# Patient Record
Sex: Female | Born: 1975 | Race: White | Hispanic: No | Smoking: Current every day smoker
Health system: Southern US, Community
[De-identification: ages and names within clinical notes are randomized; demographics above are authoritative.]

## PROBLEM LIST (undated history)

## (undated) DIAGNOSIS — N289 Disorder of kidney and ureter, unspecified: Secondary | ICD-10-CM

## (undated) HISTORY — PX: TUBAL LIGATION: SHX77

---

## 2010-12-03 ENCOUNTER — Ambulatory Visit: Payer: Self-pay

## 2011-02-04 ENCOUNTER — Emergency Department: Payer: Self-pay | Admitting: Emergency Medicine

## 2011-02-28 ENCOUNTER — Emergency Department: Payer: Self-pay | Admitting: Emergency Medicine

## 2011-03-14 ENCOUNTER — Emergency Department: Payer: Self-pay | Admitting: Emergency Medicine

## 2011-04-02 ENCOUNTER — Emergency Department: Payer: Self-pay | Admitting: Internal Medicine

## 2011-04-02 LAB — COMPREHENSIVE METABOLIC PANEL
Albumin: 4.1 g/dL (ref 3.4–5.0)
Anion Gap: 11 (ref 7–16)
Calcium, Total: 8.9 mg/dL (ref 8.5–10.1)
EGFR (African American): >60
EGFR (Non-African Amer.): >60
Glucose: 102 mg/dL — ABNORMAL HIGH (ref 65–99)
Potassium: 4.3 mmol/L (ref 3.5–5.1)
SGOT(AST): 17 U/L (ref 15–37)

## 2011-04-02 LAB — URINALYSIS, COMPLETE
Bilirubin,UR: NEGATIVE
Ketone: NEGATIVE
Ph: 6 (ref 4.5–8.0)
Protein: NEGATIVE
Specific Gravity: 1.005 (ref 1.003–1.030)
Squamous Epithelial: <1
WBC UR: <1 /HPF (ref 0–5)

## 2011-04-02 LAB — CBC
HCT: 42.4 % (ref 35.0–47.0)
MCHC: 34.7 g/dL (ref 32.0–36.0)
MCV: 91 fL (ref 80–100)
RDW: 12.2 % (ref 11.5–14.5)

## 2011-04-02 LAB — LIPASE, BLOOD: Lipase: 177 U/L (ref 73–393)

## 2011-04-02 LAB — PREGNANCY, URINE: Pregnancy Test, Urine: NEGATIVE m[IU]/mL

## 2011-05-11 ENCOUNTER — Emergency Department: Payer: Self-pay | Admitting: Emergency Medicine

## 2011-06-25 ENCOUNTER — Emergency Department: Payer: Self-pay | Admitting: Emergency Medicine

## 2011-06-25 LAB — BASIC METABOLIC PANEL
BUN: 16 mg/dL (ref 7–18)
Calcium, Total: 9.3 mg/dL (ref 8.5–10.1)
Creatinine: 0.91 mg/dL (ref 0.60–1.30)
Glucose: 121 mg/dL — ABNORMAL HIGH (ref 65–99)
Potassium: 4.1 mmol/L (ref 3.5–5.1)
Sodium: 139 mmol/L (ref 136–145)

## 2011-06-25 LAB — CBC
HGB: 15.1 g/dL (ref 12.0–16.0)
MCV: 92 fL (ref 80–100)
Platelet: 293 10*3/uL (ref 150–440)
RBC: 4.85 10*6/uL (ref 3.80–5.20)

## 2011-06-25 LAB — URINALYSIS, COMPLETE
Bacteria: NONE SEEN
Ketone: NEGATIVE
Nitrite: NEGATIVE
Protein: NEGATIVE
RBC,UR: 1 /HPF (ref 0–5)
Specific Gravity: 1.021 (ref 1.003–1.030)
WBC UR: 1 /HPF (ref 0–5)

## 2011-06-25 LAB — WET PREP, GENITAL

## 2011-07-11 ENCOUNTER — Emergency Department (HOSPITAL_COMMUNITY)
Admission: EM | Admit: 2011-07-11 | Discharge: 2011-07-12 | Disposition: A | Discharge status: Home / Self Care | Payer: Self-pay | Attending: Emergency Medicine | Admitting: Emergency Medicine

## 2011-07-11 ENCOUNTER — Encounter (HOSPITAL_COMMUNITY): Payer: Self-pay | Admitting: *Deleted

## 2011-07-11 DIAGNOSIS — N83209 Unspecified ovarian cyst, unspecified side: Secondary | ICD-10-CM | POA: Insufficient documentation

## 2011-07-11 DIAGNOSIS — R110 Nausea: Secondary | ICD-10-CM | POA: Insufficient documentation

## 2011-07-11 DIAGNOSIS — R109 Unspecified abdominal pain: Secondary | ICD-10-CM | POA: Insufficient documentation

## 2011-07-11 LAB — BASIC METABOLIC PANEL
CO2: 25 mEq/L (ref 19–32)
Calcium: 8.9 mg/dL (ref 8.4–10.5)
Chloride: 104 mEq/L (ref 96–112)
GFR calc Af Amer: >90 mL/min (ref >90)
Sodium: 137 mEq/L (ref 135–145)

## 2011-07-11 LAB — DIFFERENTIAL
Basophils Absolute: 0 10*3/uL (ref 0.0–0.1)
Eosinophils Relative: 0 % (ref 0–5)
Lymphocytes Relative: 27 % (ref 12–46)
Lymphs Abs: 2.2 10*3/uL (ref 0.7–4.0)
Neutro Abs: 5.4 10*3/uL (ref 1.7–7.7)

## 2011-07-11 LAB — URINALYSIS, ROUTINE W REFLEX MICROSCOPIC
Glucose, UA: NEGATIVE mg/dL
Ketones, ur: NEGATIVE mg/dL
Leukocytes, UA: NEGATIVE
Nitrite: NEGATIVE
Specific Gravity, Urine: 1.034 — ABNORMAL HIGH (ref 1.005–1.030)
pH: 7.5 (ref 5.0–8.0)

## 2011-07-11 LAB — CBC
MCV: 89.9 fL (ref 78.0–100.0)
Platelets: 202 10*3/uL (ref 150–400)
RBC: 4.14 MIL/uL (ref 3.87–5.11)
RDW: 13 % (ref 11.5–15.5)
WBC: 8.2 10*3/uL (ref 4.0–10.5)

## 2011-07-11 LAB — POCT PREGNANCY, URINE: Preg Test, Ur: NEGATIVE

## 2011-07-11 MED ORDER — IOHEXOL 300 MG/ML  SOLN
20.0000 mL | INTRAMUSCULAR | Status: AC
  Administered 2011-07-11: 20 mL via ORAL

## 2011-07-11 MED ORDER — HYDROMORPHONE HCL PF 1 MG/ML IJ SOLN
1.0000 mg | Freq: Once | INTRAMUSCULAR | Status: AC
  Administered 2011-07-11: 1 mg via INTRAVENOUS
  Filled 2011-07-11: qty 1

## 2011-07-11 MED ORDER — ONDANSETRON HCL 4 MG/2ML IJ SOLN
4.0000 mg | Freq: Once | INTRAMUSCULAR | Status: AC
  Administered 2011-07-11: 4 mg via INTRAVENOUS
  Filled 2011-07-11: qty 2

## 2011-07-11 MED ORDER — SODIUM CHLORIDE 0.9 % IV BOLUS (SEPSIS)
1000.0000 mL | Freq: Once | INTRAVENOUS | Status: AC
  Administered 2011-07-11: 1000 mL via INTRAVENOUS

## 2011-07-11 NOTE — ED Notes (Addendum)
Patient complaining of right upper and lower quadrant abdominal pain that radiates around her right side and to her right flank area.  Patient states that the pain started around 1200 this afternoon (before patient ate lunch).  Patient describes pain as "intermittent" and "sharp"; rates pain 8/10 on the numerical pain scale.  Reports nausea; denies vomiting and diarrhea.  Denies any urinary changes and abnormal vaginal discharge.  Patient alert and oriented x4; PERRL present.  Upon arrival to room, patient changed into gown.  Will continue to monitor.

## 2011-07-11 NOTE — ED Provider Notes (Signed)
History     CSN: [id number]  Arrival date & time 07/11/11  2040   First MD Initiated Contact with Patient 07/11/11 2159      Chief Complaint  Patient presents with  . Abdominal Pain    (Consider location/radiation/quality/duration/timing/severity/associated sxs/prior treatment) HPI History from patient. 36 year old female who presents with right-sided abdominal pain. This began around 1 PM today. It is constant in nature with slight waxes and wanes in intensity. Pain is described as sharp. She has tried Tylenol, ibuprofen, and Pepto-Bismol without any relief. Pain is improved slightly with applying pressure to the area, and worsens when she lets go. States that the pain is worsened with sudden movements; she did notice that it worsened during the car ride to the emergency department. She has had some nausea without any vomiting or diarrhea. Denies any vaginal bleeding, discharge, or urinary symptoms. Denies fever or chills. She's never had anything like this in the past. Surgical history includes tubal ligation.  History reviewed. No pertinent past medical history.  Past Surgical History  Procedure Date  . Tubal ligation     Family History  Problem Relation Age of Onset  . Heart failure Father   . Hypertension Father   . Hyperlipidemia Father     History  Substance Use Topics  . Smoking status: Current Everyday Smoker -- 0.5 packs/day  . Smokeless tobacco: Not on file  . Alcohol Use: Yes    OB History    Grav Para Term Preterm Abortions TAB SAB Ect Mult Living                  Review of Systems  Constitutional: Negative for fever, chills, activity change and appetite change.  Respiratory: Negative for cough and shortness of breath.   Cardiovascular: Negative for chest pain and palpitations.  Gastrointestinal: Positive for nausea and abdominal pain. Negative for vomiting, diarrhea and constipation.  Genitourinary: Negative for dysuria, flank pain, vaginal bleeding,  vaginal discharge and vaginal pain.  Musculoskeletal: Negative for myalgias.  Skin: Negative for color change and rash.  Neurological: Negative for dizziness and weakness.    Allergies  Penicillins  Home Medications   Current Outpatient Rx  Name Route Sig Dispense Refill  . ACETAMINOPHEN 500 MG PO TABS Oral Take 2,000 mg by mouth 2 (two) times daily as needed. For pain    . BISMUTH SUBSALICYLATE 262 MG/15ML PO SUSP Oral Take 15 mLs by mouth once. For nausea    . IBUPROFEN 200 MG PO TABS Oral Take 800 mg by mouth once. For pain      BP 140/76  Pulse 71  Temp(Src) 98.3 F (36.8 C) (Oral)  Resp 16  SpO2 98%  LMP 05/07/2011  Physical Exam  Nursing note and vitals reviewed. Constitutional: She is oriented to person, place, and time. She appears well-developed and well-nourished. No distress.       Uncomfortable appearing  HENT:  Head: Normocephalic and atraumatic.  Eyes: EOM are normal.  Neck: Normal range of motion.  Cardiovascular: Normal rate, regular rhythm and normal heart sounds.   Pulmonary/Chest: Effort normal and breath sounds normal. She exhibits no tenderness.  Abdominal: Soft. Bowel sounds are normal. There is tenderness in the right lower quadrant. There is rebound. There is no guarding and no CVA tenderness.    Musculoskeletal: Normal range of motion.  Neurological: She is alert and oriented to person, place, and time.  Skin: Skin is warm and dry. She is not diaphoretic.  Psychiatric: She has a  normal mood and affect.    ED Course  Procedures (including critical care time)  Labs Reviewed  BASIC METABOLIC PANEL - Abnormal; Notable for the following:    Glucose, Bld 105 (*)    All other components within normal limits  URINALYSIS, ROUTINE W REFLEX MICROSCOPIC - Abnormal; Notable for the following:    Specific Gravity, Urine 1.034 (*)    All other components within normal limits  CBC  DIFFERENTIAL  POCT PREGNANCY, URINE   Ct Abdomen Pelvis W  Contrast  07/12/2011  *RADIOLOGY REPORT*  Clinical Data: Right lower quadrant abdominal pain  CT ABDOMEN AND PELVIS WITH CONTRAST  Technique:  Multidetector CT imaging of the abdomen and pelvis was performed following the standard protocol during bolus administration of intravenous contrast.  Contrast: OMNIPAQUE IOHEXOL 300 MG/ML  SOLN  Comparison: None.  Findings: Limited images through the lung bases demonstrate no significant appreciable abnormality. The heart size is within normal limits. No pleural or pericardial effusion.  Unremarkable liver, biliary system, spleen, pancreas, adrenal glands, kidneys.  No hydronephrosis or hydroureter.  No bowel obstruction.  No CT evidence for colitis.  Appendix within normal limits.  No free intraperitoneal air.  Thin-walled bladder.  Corpus luteal cyst noted on the right.  There is a small amount of free fluid within the pelvis. Adnexal clip noted on the right.  No acute osseous abnormality.  IMPRESSION: Normal appendix.  Corpus luteal cyst noted on the right and a small amount of free fluid is present within the pelvis.  This is likely physiologic. If there remains clinical concern for ovarian pathology, consider ultrasound.  Original Report Authenticated By: Waneta Martins, M.D.     No diagnosis found.    MDM  10:30 PM Patient assessed. Tender to palpation with rebound in the right lower quadrant. Suspicious for appendicitis. We'll plan to obtain CT of abdomen and pelvis. Labs which were ordered in triage generally unremarkable. Dilaudid ordered for pain mgmt.  2:32 AM Pt's CT without evidence for acute appendicitis. She does have some free fluid in the pelvis and a corpus luteal cyst. Still having pain at this point despite 2mg  total of Dilaudid; states the meds do help briefly but pain comes back. Abd remains soft, tender to palp in RLQ. Will plan to proceed with US pelvis to r/o ovarian pathology. Pt care signed out to Dr. Nicanor Alcon. She will  follow results and dispo as appropriate.      Grant Fontana, [place] 07/12/11 [phone]

## 2011-07-11 NOTE — ED Notes (Signed)
Patient currently sitting up in bed; no respiratory or acute distress noted.  Patient currently rating pain 7/10; patient medicated.  Patient has no other questions or concerns at this time; patient currently finished one contrast/water cup.  CT at bedside to check up on bedside.  Will continue to monitor.

## 2011-07-11 NOTE — ED Notes (Addendum)
C/o mid R abd pain, onset after lunch around 1300, fluctuates, comes back worse every time, some nausea & radiation around to R flank, but primarily mid R abd, (denies: groin pain, vd, fever, bleeding, urinary or vaginal sx). Took tylenol and ibuprofen all day today and pepto bismol w/o relief. "worse lying flat, applied pressure helps, worse when she lets go". Mild moaning/grunting at this time, tachypneic, applying pressure to R abd. Alert, interactive. Last ate 1730.

## 2011-07-11 NOTE — ED Notes (Signed)
Patient currently resting quietly in bed; no respiratory or acute distress noted.  Patient updated on plan of care; informed patient that she is going to be taken for a CT scan.  Patient has no other questions or concerns at this time; will continue to monitor.

## 2011-07-12 ENCOUNTER — Emergency Department (HOSPITAL_COMMUNITY): Payer: Self-pay

## 2011-07-12 MED ORDER — HYDROMORPHONE HCL PF 1 MG/ML IJ SOLN
1.0000 mg | Freq: Once | INTRAMUSCULAR | Status: AC
  Administered 2011-07-12: 1 mg via INTRAVENOUS
  Filled 2011-07-12: qty 1

## 2011-07-12 MED ORDER — IOHEXOL 300 MG/ML  SOLN
100.0000 mL | Freq: Once | INTRAMUSCULAR | Status: AC | PRN
  Administered 2011-07-12: 100 mL via INTRAVENOUS

## 2011-07-12 MED ORDER — TRAMADOL HCL 50 MG PO TABS: 50.0000 mg | ORAL_TABLET | Freq: Four times a day (QID) | ORAL | Status: AC | PRN

## 2011-07-12 MED ORDER — OXYCODONE-ACETAMINOPHEN 5-325 MG PO TABS
1.0000 | ORAL_TABLET | Freq: Once | ORAL | Status: AC
  Administered 2011-07-12: 1 via ORAL
  Filled 2011-07-12: qty 1

## 2011-07-12 NOTE — ED Notes (Signed)
Patient ambulated to restroom with no difficulty; currently sitting up in bed; no respiratory or acute distress noted.  Patient states that she is done drinking CT contrast/water cups.  CT called and notified.  Patient has no other questions or concerns at this time; will continue to monitor.

## 2011-07-12 NOTE — ED Notes (Signed)
PA Catherine at bedside.

## 2011-07-12 NOTE — ED Notes (Signed)
Patient currently resting quietly in bed; no respiratory or acute distress noted.  Updated patient on plan of care; informed patient that CT results are back and that we are currently waiting on EDP to come and talk about results.  Patient has no other questions or concerns; will continue to monitor.

## 2011-07-12 NOTE — ED Notes (Signed)
Patient back from CT; currently sitting up in bed; no respiratory or acute distress noted.  Patient updated on plan of care; informed patient that we are currently waiting on CT results to come back.  Patient has no other questions or concerns; will continue to monitor.

## 2011-07-12 NOTE — Discharge Instructions (Signed)
Ovarian Cyst The ovaries are small organs that are on each side of the uterus. The ovaries are the organs that produce the female hormones, estrogen and progesterone. An ovarian cyst is a sac filled with fluid that can vary in its size. It is normal for a small cyst to form in women who are in the childbearing age and who have menstrual periods. This type of cyst is called a follicle cyst that becomes an ovulation cyst (corpus luteum cyst) after it produces the women's egg. It later goes away on its own if the woman does not become pregnant. There are other kinds of ovarian cysts that may cause problems and may need to be treated. The most serious problem is a cyst with cancer. It should be noted that menopausal women who have an ovarian cyst are at a higher risk of it being a cancer cyst. They should be evaluated very quickly, thoroughly and followed closely. This is especially true in menopausal women because of the high rate of ovarian cancer in women in menopause. CAUSES AND TYPES OF OVARIAN CYSTS:  FUNCTIONAL CYST: The follicle/corpus luteum cyst is a functional cyst that occurs every month during ovulation with the menstrual cycle. They go away with the next menstrual cycle if the woman does not get pregnant. Usually, there are no symptoms with a functional cyst.   ENDOMETRIOMA CYST: This cyst develops from the lining of the uterus tissue. This cyst gets in or on the ovary. It grows every month from the bleeding during the menstrual period. It is also called a "chocolate cyst" because it becomes filled with blood that turns brown. This cyst can cause pain in the lower abdomen during intercourse and with your menstrual period.   CYSTADENOMA CYST: This cyst develops from the cells on the outside of the ovary. They usually are not cancerous. They can get very big and cause lower abdomen pain and pain with intercourse. This type of cyst can twist on itself, cut off its blood supply and cause severe pain.  It also can easily rupture and cause a lot of pain.   DERMOID CYST: This type of cyst is sometimes found in both ovaries. They are found to have different kinds of body tissue in the cyst. The tissue includes skin, teeth, hair, and/or cartilage. They usually do not have symptoms unless they get very big. Dermoid cysts are rarely cancerous.   POLYCYSTIC OVARY: This is a rare condition with hormone problems that produces many small cysts on both ovaries. The cysts are follicle-like cysts that never produce an egg and become a corpus luteum. It can cause an increase in body weight, infertility, acne, increase in body and facial hair and lack of menstrual periods or rare menstrual periods. Many women with this problem develop type 2 diabetes. The exact cause of this problem is unknown. A polycystic ovary is rarely cancerous.   THECA LUTEIN CYST: Occurs when too much hormone (human chorionic gonadotropin) is produced and over-stimulates the ovaries to produce an egg. They are frequently seen when doctors stimulate the ovaries for invitro-fertilization (test tube babies).   LUTEOMA CYST: This cyst is seen during pregnancy. Rarely it can cause an obstruction to the birth canal during labor and delivery. They usually go away after delivery.  SYMPTOMS   Pelvic pain or pressure.   Pain during sexual intercourse.   Increasing girth (swelling) of the abdomen.   Abnormal menstrual periods.   Increasing pain with menstrual periods.   You stop having   menstrual periods and you are not pregnant.  DIAGNOSIS  The diagnosis can be made during:  Routine or annual pelvic examination (common).   Ultrasound.   X-ray of the pelvis.   CT Scan.   MRI.   Blood tests.  TREATMENT   Treatment may only be to follow the cyst monthly for 2 to 3 months with your caregiver. Many go away on their own, especially functional cysts.   May be aspirated (drained) with a long needle with ultrasound, or by laparoscopy  (inserting a tube into the pelvis through a small incision).   The whole cyst can be removed by laparoscopy.   Sometimes the cyst may need to be removed through an incision in the lower abdomen.   Hormone treatment is sometimes used to help dissolve certain cysts.   Birth control pills are sometimes used to help dissolve certain cysts.  HOME CARE INSTRUCTIONS  Follow your caregiver's advice regarding:  Medicine.   Follow up visits to evaluate and treat the cyst.   You may need to come back or make an appointment with another caregiver, to find the exact cause of your cyst, if your caregiver is not a gynecologist.   Get your yearly and recommended pelvic examinations and Pap tests.   Let your caregiver know if you have had an ovarian cyst in the past.  SEEK MEDICAL CARE IF:   Your periods are late, irregular, they stop, or are painful.   Your stomach (abdomen) or pelvic pain does not go away.   Your stomach becomes larger or swollen.   You have pressure on your bladder or trouble emptying your bladder completely.   You have painful sexual intercourse.   You have feelings of fullness, pressure, or discomfort in your stomach.   You lose weight for no apparent reason.   You feel generally ill.   You become constipated.   You lose your appetite.   You develop acne.   You have an increase in body and facial hair.   You are gaining weight, without changing your exercise and eating habits.   You think you are pregnant.  SEEK IMMEDIATE MEDICAL CARE IF:   You have increasing abdominal pain.   You feel sick to your stomach (nausea) and/or vomit.   You develop a fever that comes on suddenly.   You develop abdominal pain during a bowel movement.   Your menstrual periods become heavier than usual.  Document Released: 03/03/2005 Document Revised: 02/20/2011 Document Reviewed: 01/04/2009 [employer] Patient Information 2012 [employer]. 

## 2011-07-12 NOTE — ED Notes (Signed)
Pt transported to [place] with chaperone.

## 2011-07-12 NOTE — ED Notes (Signed)
Pt tranferred from POD A7 with c/o 8/10 RLQ abd pain and nausea.  Abd soft, tender to touch on RLQ.  Active bowel sounds.  LBM 07/11/11  LMP 05/07/11.  Awaiting [place].  Husband at bedside.

## 2011-07-14 NOTE — ED Provider Notes (Signed)
Medical screening examination/treatment/procedure(s) were performed by non-physician practitioner and as supervising physician I was immediately available for consultation/collaboration.  Cheri Guppy, MD 07/14/11 1556

## 2011-07-19 NOTE — ED Notes (Signed)
Attempted to contact patient x two today regarding CT findings. Message left to call flow manager's number#.

## 2011-07-20 NOTE — ED Notes (Signed)
Left voicemail for patient to call flow manager's office number

## 2011-07-30 ENCOUNTER — Emergency Department: Payer: Self-pay

## 2011-07-30 LAB — URINALYSIS, COMPLETE
Bacteria: NONE SEEN
Ketone: NEGATIVE
Leukocyte Esterase: NEGATIVE
Ph: 8 (ref 4.5–8.0)
Squamous Epithelial: 2

## 2011-07-30 LAB — COMPREHENSIVE METABOLIC PANEL
Albumin: 3.6 g/dL (ref 3.4–5.0)
Alkaline Phosphatase: 50 U/L (ref 50–136)
Anion Gap: 5 — ABNORMAL LOW (ref 7–16)
BUN: 9 mg/dL (ref 7–18)
Bilirubin,Total: 0.2 mg/dL (ref 0.2–1.0)
Calcium, Total: 8.8 mg/dL (ref 8.5–10.1)
Co2: 27 mmol/L (ref 21–32)
Creatinine: 0.61 mg/dL (ref 0.60–1.30)
EGFR (African American): >60
Osmolality: 276 (ref 275–301)
Potassium: 4 mmol/L (ref 3.5–5.1)
Sodium: 139 mmol/L (ref 136–145)

## 2011-07-30 LAB — CBC
HCT: 38.7 % (ref 35.0–47.0)
MCHC: 34.6 g/dL (ref 32.0–36.0)
MCV: 93 fL (ref 80–100)
RBC: 4.17 10*6/uL (ref 3.80–5.20)
WBC: 5.2 10*3/uL (ref 3.6–11.0)

## 2011-07-30 LAB — DRUG SCREEN, URINE
MDMA (Ecstasy)Ur Screen: NEGATIVE (ref ?–500)
Opiate, Ur Screen: NEGATIVE (ref ?–300)

## 2011-07-31 NOTE — ED Notes (Signed)
Patient called flow manager's office after receiving letter to call. Information given regarding CT/US findings and instructed patient to follow-up with GYN as suggested by radiologist. Patient verbalized understanding and stated she would seek follow-up with own GYN as soon as possible.

## 2011-08-02 ENCOUNTER — Emergency Department: Payer: Self-pay | Admitting: Emergency Medicine

## 2011-08-02 LAB — ETHANOL: Ethanol: <3 mg/dL

## 2011-08-02 LAB — CBC
HCT: 40.3 % (ref 35.0–47.0)
MCV: 92 fL (ref 80–100)
Platelet: 256 10*3/uL (ref 150–440)
RBC: 4.37 10*6/uL (ref 3.80–5.20)
RDW: 13.6 % (ref 11.5–14.5)
WBC: 7.3 10*3/uL (ref 3.6–11.0)

## 2011-08-02 LAB — COMPREHENSIVE METABOLIC PANEL
BUN: 8 mg/dL (ref 7–18)
Co2: 26 mmol/L (ref 21–32)
Creatinine: 0.73 mg/dL (ref 0.60–1.30)
Potassium: 3.4 mmol/L — ABNORMAL LOW (ref 3.5–5.1)

## 2011-08-02 LAB — TSH: Thyroid Stimulating Horm: 1.82 u[IU]/mL

## 2011-08-02 LAB — SALICYLATE LEVEL: Salicylates, Serum: 2.9 mg/dL — ABNORMAL HIGH

## 2011-08-02 LAB — ACETAMINOPHEN LEVEL: Acetaminophen: <2 ug/mL

## 2011-08-03 LAB — DRUG SCREEN, URINE
Cannabinoid 50 Ng, Ur ~~LOC~~: NEGATIVE (ref ?–50)
Methadone, Ur Screen: NEGATIVE (ref ?–300)
Opiate, Ur Screen: NEGATIVE (ref ?–300)
Tricyclic, Ur Screen: NEGATIVE (ref ?–1000)

## 2011-08-04 ENCOUNTER — Emergency Department: Payer: Self-pay | Admitting: Emergency Medicine

## 2011-09-03 ENCOUNTER — Emergency Department: Payer: Self-pay | Admitting: *Deleted

## 2011-09-06 ENCOUNTER — Emergency Department: Payer: Self-pay | Admitting: Emergency Medicine

## 2011-09-06 LAB — URINALYSIS, COMPLETE
Glucose,UR: NEGATIVE mg/dL (ref 0–75)
Ketone: NEGATIVE
Leukocyte Esterase: NEGATIVE
Nitrite: NEGATIVE
Ph: 7 (ref 4.5–8.0)
RBC,UR: <1 /HPF (ref 0–5)

## 2011-09-06 LAB — COMPREHENSIVE METABOLIC PANEL
Anion Gap: 11 (ref 7–16)
BUN: 8 mg/dL (ref 7–18)
EGFR (African American): >60
EGFR (Non-African Amer.): >60
Total Protein: 8.1 g/dL (ref 6.4–8.2)

## 2011-09-06 LAB — CBC
HCT: 40.8 % (ref 35.0–47.0)
HGB: 13.8 g/dL (ref 12.0–16.0)
MCHC: 33.8 g/dL (ref 32.0–36.0)
MCV: 91 fL (ref 80–100)
Platelet: 245 10*3/uL (ref 150–440)
RBC: 4.46 10*6/uL (ref 3.80–5.20)
RDW: 13.2 % (ref 11.5–14.5)

## 2011-09-06 LAB — PREGNANCY, URINE: Pregnancy Test, Urine: NEGATIVE m[IU]/mL

## 2012-01-03 ENCOUNTER — Emergency Department (HOSPITAL_COMMUNITY)
Admission: EM | Admit: 2012-01-03 | Discharge: 2012-01-04 | Disposition: A | Discharge status: Home / Self Care | Payer: Self-pay | Attending: Emergency Medicine | Admitting: Emergency Medicine

## 2012-01-03 ENCOUNTER — Encounter (HOSPITAL_COMMUNITY): Payer: Self-pay

## 2012-01-03 ENCOUNTER — Emergency Department (HOSPITAL_COMMUNITY): Payer: Self-pay

## 2012-01-03 DIAGNOSIS — R109 Unspecified abdominal pain: Secondary | ICD-10-CM | POA: Insufficient documentation

## 2012-01-03 DIAGNOSIS — R300 Dysuria: Secondary | ICD-10-CM | POA: Insufficient documentation

## 2012-01-03 HISTORY — DX: Disorder of kidney and ureter, unspecified: N28.9

## 2012-01-03 LAB — CBC WITH DIFFERENTIAL/PLATELET
Basophils Absolute: 0 10*3/uL (ref 0.0–0.1)
Eosinophils Absolute: 0.1 10*3/uL (ref 0.0–0.7)
Eosinophils Relative: 1 % (ref 0–5)
Lymphocytes Relative: 30 % (ref 12–46)
Lymphs Abs: 2.5 10*3/uL (ref 0.7–4.0)
MCV: 88.1 fL (ref 78.0–100.0)
Neutrophils Relative %: 66 % (ref 43–77)
Platelets: 202 10*3/uL (ref 150–400)
RBC: 4.47 MIL/uL (ref 3.87–5.11)
RDW: 12.2 % (ref 11.5–15.5)
WBC: 8.5 10*3/uL (ref 4.0–10.5)

## 2012-01-03 LAB — COMPREHENSIVE METABOLIC PANEL
ALT: 8 U/L (ref 0–35)
AST: 16 U/L (ref 0–37)
Alkaline Phosphatase: 58 U/L (ref 39–117)
CO2: 30 mEq/L (ref 19–32)
Calcium: 9.4 mg/dL (ref 8.4–10.5)
Potassium: 3.4 mEq/L — ABNORMAL LOW (ref 3.5–5.1)
Sodium: 141 mEq/L (ref 135–145)
Total Protein: 7.5 g/dL (ref 6.0–8.3)

## 2012-01-03 LAB — URINE MICROSCOPIC-ADD ON

## 2012-01-03 LAB — URINALYSIS, ROUTINE W REFLEX MICROSCOPIC
Nitrite: NEGATIVE
Protein, ur: NEGATIVE mg/dL
Specific Gravity, Urine: 1.033 — ABNORMAL HIGH (ref 1.005–1.030)
Urobilinogen, UA: 0.2 mg/dL (ref 0.0–1.0)

## 2012-01-03 MED ORDER — HYDROMORPHONE HCL PF 1 MG/ML IJ SOLN
1.0000 mg | Freq: Once | INTRAMUSCULAR | Status: AC
  Administered 2012-01-03: 1 mg via INTRAVENOUS
  Filled 2012-01-03: qty 1

## 2012-01-03 MED ORDER — ONDANSETRON HCL 4 MG/2ML IJ SOLN
4.0000 mg | Freq: Once | INTRAMUSCULAR | Status: AC
  Administered 2012-01-03: 4 mg via INTRAVENOUS
  Filled 2012-01-03: qty 2

## 2012-01-03 NOTE — ED Notes (Signed)
Physician at bedside.

## 2012-01-03 NOTE — ED Notes (Signed)
Pt. Reports right flank and lower abdominal pain starting this evening. States difficulty starting urination and passed 2 small kidney stones x2 weeks ago. Denies pain/burning or frequency with urination. Pt. Denies vaginal discharge, hx of ovarian cysts.

## 2012-01-03 NOTE — ED Notes (Signed)
Pt reports intermittent (R) flank pain x3 hours, pt reports hx of kidney stones. Pt denies burning w/urination

## 2012-01-03 NOTE — ED Notes (Signed)
Patient updated on wait time; apologized about wait time. Patient verbalized understanding; denies changes in condition at this time. Will continue to monitor.

## 2012-01-03 NOTE — ED Provider Notes (Signed)
History     CSN: [id number]  Arrival date & time 01/03/12  2009   First MD Initiated Contact with Patient 01/03/12 2257      Chief Complaint  Patient presents with  . Flank Pain    (Consider location/radiation/quality/duration/timing/severity/associated sxs/prior treatment) HPI Comments: 36 year old female with a history of intermittent kidney stones who presents with acute onset of right flank pain radiating to the right lower quadrant which is sharp and stabbing, waxes and wanes and does not seem to be worse with eating, movement, urination or bowel movements. She denies fevers chills and vomiting though she does endorse nausea. She has no dysuria hematuria diarrhea or rectal bleeding. She passed 2 small kidney stones approximately 2 weeks ago and had known kidney stone 7 years ago. Her pain was classified as severe this evening  Patient is a 36 y.o. female presenting with flank pain. The history is provided by the patient and a friend.  Flank Pain This is a new problem.    Past Medical History  Diagnosis Date  . Renal disorder     Past Surgical History  Procedure Date  . Tubal ligation     Family History  Problem Relation Age of Onset  . Heart failure Father   . Hypertension Father   . Hyperlipidemia Father     History  Substance Use Topics  . Smoking status: Current Every Day Smoker -- 0.5 packs/day  . Smokeless tobacco: Not on file  . Alcohol Use: Yes    OB History    Grav Para Term Preterm Abortions TAB SAB Ect Mult Living                  Review of Systems  Genitourinary: Positive for flank pain.  All other systems reviewed and are negative.    Allergies  Banana; Penicillins; Watermelon concentrate; and Codeine  Home Medications   Current Outpatient Rx  Name Route Sig Dispense Refill  . ACETAMINOPHEN 500 MG PO TABS Oral Take 2,000 mg by mouth 2 (two) times daily as needed. For pain    . HYDROXYZINE HCL 10 MG PO TABS Oral Take 10 mg by mouth 2  (two) times daily.    . IBUPROFEN 200 MG PO TABS Oral Take 800 mg by mouth once. For pain    . NAPROXEN 500 MG PO TABS Oral Take 1 tablet (500 mg total) by mouth 2 (two) times daily with a meal. 30 tablet 0  . OXYCODONE-ACETAMINOPHEN 5-325 MG PO TABS Oral Take 1 tablet by mouth every 4 (four) hours as needed for pain. 20 tablet 0    BP 153/51  Pulse 86  Temp 97.6 F (36.4 C) (Oral)  Resp 18  SpO2 100%  LMP 12/24/2011  Physical Exam  Nursing note and vitals reviewed. Constitutional: She appears well-developed and well-nourished.       Uncomfortable appearing  HENT:  Head: Normocephalic and atraumatic.  Mouth/Throat: Oropharynx is clear and moist. No oropharyngeal exudate.  Eyes: Conjunctivae normal and EOM are normal. Pupils are equal, round, and reactive to light. Right eye exhibits no discharge. Left eye exhibits no discharge. No scleral icterus.  Neck: Normal range of motion. Neck supple. No JVD present. No thyromegaly present.  Cardiovascular: Normal rate, regular rhythm, normal heart sounds and intact distal pulses.  Exam reveals no gallop and no friction rub.   No murmur heard. Pulmonary/Chest: Effort normal and breath sounds normal. No respiratory distress. She has no wheezes. She has no rales.  Abdominal: Soft.  Bowel sounds are normal. She exhibits no distension and no mass. There is tenderness ( Minimal right-sided tenderness, no Murphy sign, no pain at McBurney's point, no suprapubic tenderness, mild right CVA tenderness). There is no rebound and no guarding.       Very soft abdomen, no peritoneal signs  Musculoskeletal: Normal range of motion. She exhibits no edema and no tenderness.  Lymphadenopathy:    She has no cervical adenopathy.  Neurological: She is alert. Coordination normal.  Skin: Skin is warm and dry. No rash noted. No erythema.  Psychiatric: She has a normal mood and affect. Her behavior is normal.    ED Course  Procedures (including critical care  time)  Labs Reviewed  URINALYSIS, ROUTINE W REFLEX MICROSCOPIC - Abnormal; Notable for the following:    Specific Gravity, Urine 1.033 (*)     Hgb urine dipstick TRACE (*)     All other components within normal limits  COMPREHENSIVE METABOLIC PANEL - Abnormal; Notable for the following:    Potassium 3.4 (*)     Glucose, Bld 106 (*)     Total Bilirubin 0.2 (*)     All other components within normal limits  URINE MICROSCOPIC-ADD ON - Abnormal; Notable for the following:    Squamous Epithelial / LPF FEW (*)     All other components within normal limits  CBC WITH DIFFERENTIAL  POCT PREGNANCY, URINE   Ct Abdomen Pelvis Wo Contrast  01/03/2012  *RADIOLOGY REPORT*  Clinical Data: Intermittent right flank pain, dysuria, history of kidney stones  CT ABDOMEN AND PELVIS WITHOUT CONTRAST  Technique:  Multidetector CT imaging of the abdomen and pelvis was performed following the standard protocol without intravenous contrast.  Comparison: 07/12/2011  Findings: Lung bases are essentially clear.  Unenhanced liver, spleen, pancreas, and adrenal glands within normal limits.  Gallbladder is underdistended.  No intrahepatic or extrahepatic ductal dilatation.  Kidneys are unremarkable.  No renal calculi or hydronephrosis.  No evidence of bowel obstruction.  Normal appendix.  No evidence of abdominal aortic aneurysm.  No abdominopelvic ascites.  No suspicious abdominopelvic lymphadenopathy.  Uterus and bilateral ovaries are unremarkable.  Right tubal ligation clips.  No ureteral or bladder calculi.  Visualized osseous structures are within normal limits.  IMPRESSION: No renal, ureteral, or bladder calculi.  No hydronephrosis.  Normal appendix.  No evidence of bowel obstruction.  No CT findings to count for the patient's abdominal pain.   Original Report Authenticated By: Charline Bills, M.D.      1. Abdominal pain       MDM  Possible recurrent kidney stone, labs suggest normal blood counts, normal liver  function tests and a urinalysis with scant hematuria. Would consider large stone, unlikely to be infectious given urinalysis, CT scan pending. Dilaudid and Zofran with fluids.   CT scan reviewed showing no signs of acute abnormalities, the patient has been given pain medication and appears stable for discharge.   Discharge Prescriptions include:   Naprosyn  Percocet     Vida Roller, MD 01/04/12 [id number]

## 2012-01-04 MED ORDER — OXYCODONE-ACETAMINOPHEN 5-325 MG PO TABS: 1.0000 | ORAL_TABLET | ORAL | Status: AC | PRN

## 2012-01-04 MED ORDER — NAPROXEN 500 MG PO TABS: 500.0000 mg | ORAL_TABLET | Freq: Two times a day (BID) | ORAL | Status: AC

## 2012-01-04 MED ORDER — OXYCODONE-ACETAMINOPHEN 5-325 MG PO TABS
2.0000 | ORAL_TABLET | Freq: Once | ORAL | Status: AC
  Administered 2012-01-04: 2 via ORAL
  Filled 2012-01-04: qty 2

## 2012-01-15 ENCOUNTER — Emergency Department: Payer: Self-pay | Admitting: Emergency Medicine

## 2012-01-15 LAB — CBC
HCT: 36.7 % (ref 35.0–47.0)
HGB: 12.9 g/dL (ref 12.0–16.0)
MCHC: 35.2 g/dL (ref 32.0–36.0)
RBC: 4.14 10*6/uL (ref 3.80–5.20)

## 2012-01-15 LAB — BASIC METABOLIC PANEL
Anion Gap: 7 (ref 7–16)
Chloride: 108 mmol/L — ABNORMAL HIGH (ref 98–107)
EGFR (Non-African Amer.): >60
Osmolality: 284 (ref 275–301)
Potassium: 3.7 mmol/L (ref 3.5–5.1)

## 2012-01-15 LAB — URINALYSIS, COMPLETE
Bacteria: NONE SEEN
Glucose,UR: NEGATIVE mg/dL (ref 0–75)
Leukocyte Esterase: NEGATIVE
Nitrite: NEGATIVE

## 2012-04-20 ENCOUNTER — Emergency Department: Payer: Self-pay | Admitting: Emergency Medicine

## 2012-04-20 LAB — CBC
MCHC: 33.9 g/dL (ref 32.0–36.0)
MCV: 90 fL (ref 80–100)
WBC: 6.8 10*3/uL (ref 3.6–11.0)

## 2012-04-20 LAB — COMPREHENSIVE METABOLIC PANEL
Albumin: 3.9 g/dL (ref 3.4–5.0)
BUN: 10 mg/dL (ref 7–18)
Chloride: 107 mmol/L (ref 98–107)
Co2: 27 mmol/L (ref 21–32)
Creatinine: 0.65 mg/dL (ref 0.60–1.30)
Glucose: 94 mg/dL (ref 65–99)
Osmolality: 276 (ref 275–301)
Sodium: 139 mmol/L (ref 136–145)

## 2012-04-20 LAB — URINALYSIS, COMPLETE
Bilirubin,UR: NEGATIVE
Ketone: NEGATIVE
Protein: NEGATIVE
RBC,UR: <1 /HPF (ref 0–5)
Squamous Epithelial: 5

## 2012-04-20 LAB — LIPASE, BLOOD: Lipase: 190 U/L (ref 73–393)

## 2012-04-21 ENCOUNTER — Inpatient Hospital Stay: Payer: Self-pay | Admitting: Psychiatry

## 2012-04-21 LAB — DRUG SCREEN, URINE
Barbiturates, Ur Screen: NEGATIVE (ref ?–200)
Benzodiazepine, Ur Scrn: POSITIVE (ref ?–200)
Cannabinoid 50 Ng, Ur ~~LOC~~: NEGATIVE (ref ?–50)
Cocaine Metabolite,Ur ~~LOC~~: NEGATIVE (ref ?–300)
Opiate, Ur Screen: NEGATIVE (ref ?–300)

## 2013-02-09 ENCOUNTER — Emergency Department: Payer: Self-pay | Admitting: Emergency Medicine

## 2013-02-09 LAB — CBC
HCT: 33.3 % — ABNORMAL LOW (ref 35.0–47.0)
MCH: 29.1 pg (ref 26.0–34.0)
MCHC: 33.8 g/dL (ref 32.0–36.0)
MCV: 86 fL (ref 80–100)
Platelet: 195 10*3/uL (ref 150–440)
RDW: 13.2 % (ref 11.5–14.5)
WBC: 6.3 10*3/uL (ref 3.6–11.0)

## 2013-02-09 LAB — COMPREHENSIVE METABOLIC PANEL
Albumin: 3.3 g/dL — ABNORMAL LOW (ref 3.4–5.0)
Alkaline Phosphatase: 57 U/L
Anion Gap: 3 — ABNORMAL LOW (ref 7–16)
Bilirubin,Total: 0.1 mg/dL — ABNORMAL LOW (ref 0.2–1.0)
Chloride: 108 mmol/L — ABNORMAL HIGH (ref 98–107)
EGFR (African American): >60
Osmolality: 277 (ref 275–301)
Potassium: 3.6 mmol/L (ref 3.5–5.1)
SGPT (ALT): 19 U/L (ref 12–78)
Sodium: 140 mmol/L (ref 136–145)
Total Protein: 6.8 g/dL (ref 6.4–8.2)

## 2013-02-09 LAB — DRUG SCREEN, URINE
Amphetamines, Ur Screen: NEGATIVE (ref ?–1000)
Barbiturates, Ur Screen: NEGATIVE (ref ?–200)
Cocaine Metabolite,Ur ~~LOC~~: NEGATIVE (ref ?–300)
MDMA (Ecstasy)Ur Screen: NEGATIVE (ref ?–500)
Opiate, Ur Screen: POSITIVE (ref ?–300)

## 2013-02-09 LAB — URINALYSIS, COMPLETE
Bilirubin,UR: NEGATIVE
Glucose,UR: NEGATIVE mg/dL (ref 0–75)
Ketone: NEGATIVE
RBC,UR: 1522 /HPF (ref 0–5)

## 2013-02-09 LAB — LIPASE, BLOOD: Lipase: 81 U/L (ref 73–393)

## 2013-03-11 IMAGING — CT CT STONE STUDY
1 of 2 series · 15 of 32 positions shown, 19 images · non-contrast
Comparison: none

REASON FOR EXAM: left sided pain
COMMENTS:

PROCEDURE:     CT  - CT ABDOMEN /PELVIS WO (STONE)  - June 25, 2011  [DATE]
RESULT:     Comparison: 04/02/2011
TECHNIQUE: Multiple axial images from the lung bases to the symphysis pubis
were obtained without oral and without intravenous contrast.

[Series 2: 3mm soft tissue · axial · 0.62mm/px · z∈[-426,-30]mm · 15 of 144 slices shown, 19 images]
[im 6/144  soft-tissue]
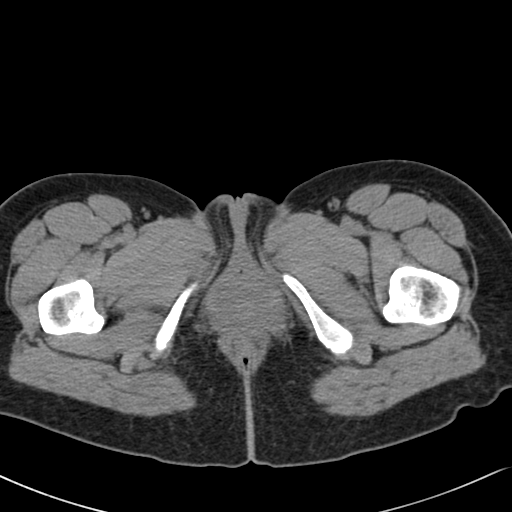
[im 6/144  bone]
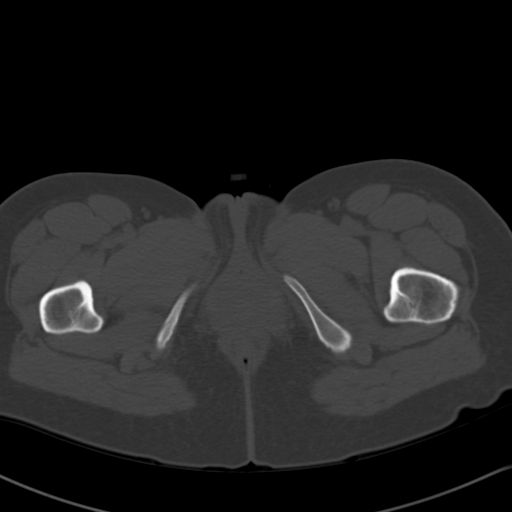
[im 18/144  soft-tissue]
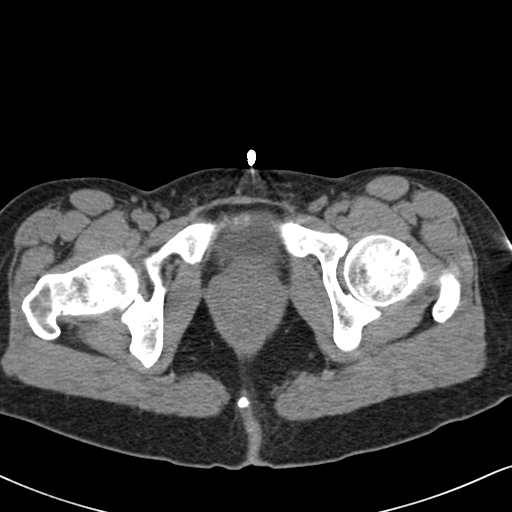
[im 30/144  soft-tissue]
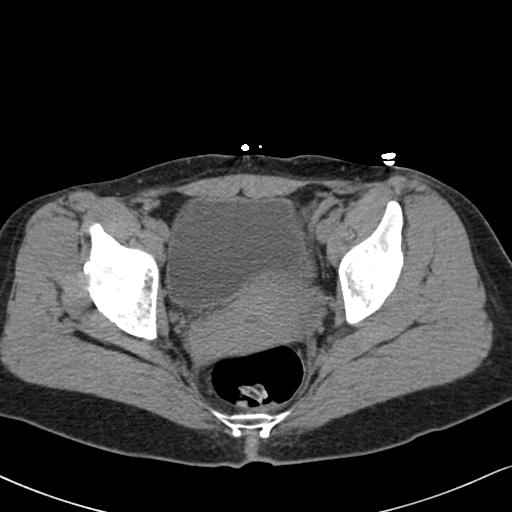
[im 42/144  soft-tissue]
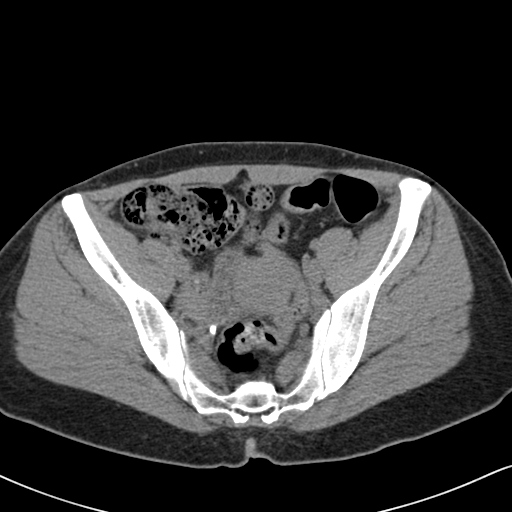
[im 48/144  soft-tissue]
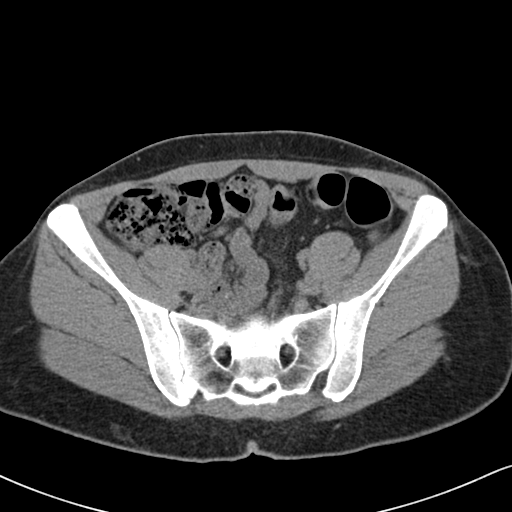
[im 60/144  soft-tissue]
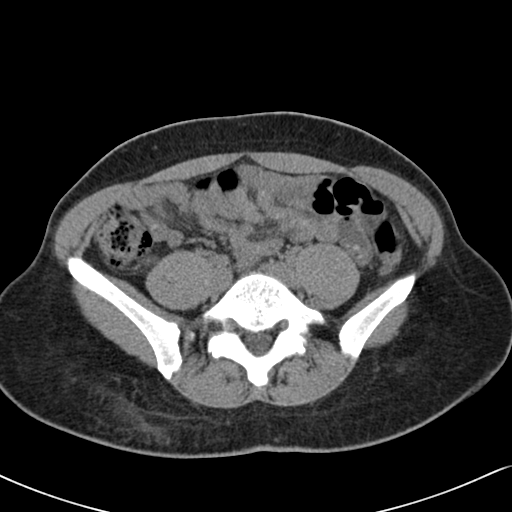
[im 72/144  soft-tissue]
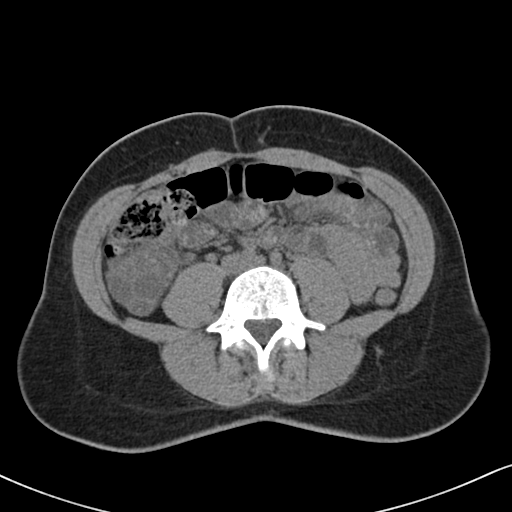
[im 84/144  soft-tissue]
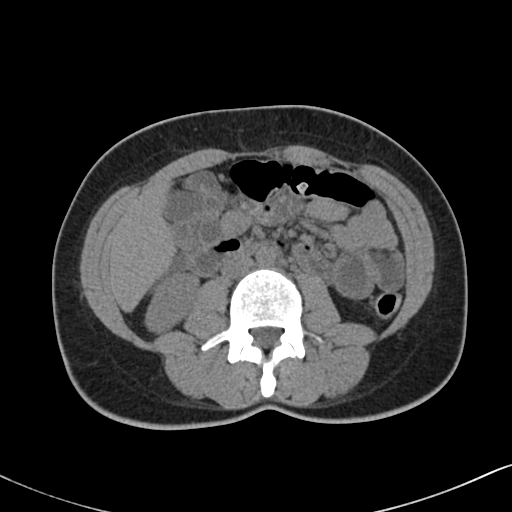
[im 96/144  soft-tissue]
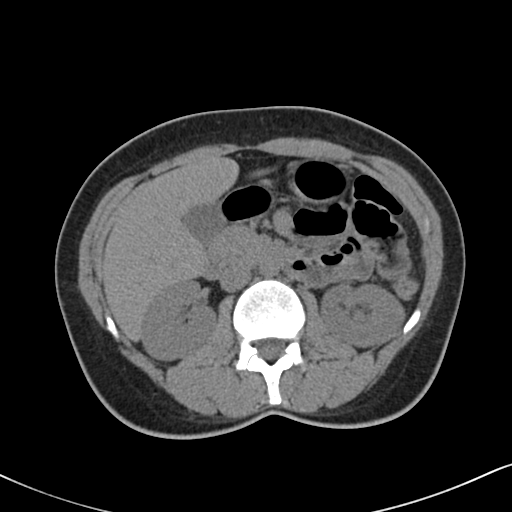
[im 96/144  bone]
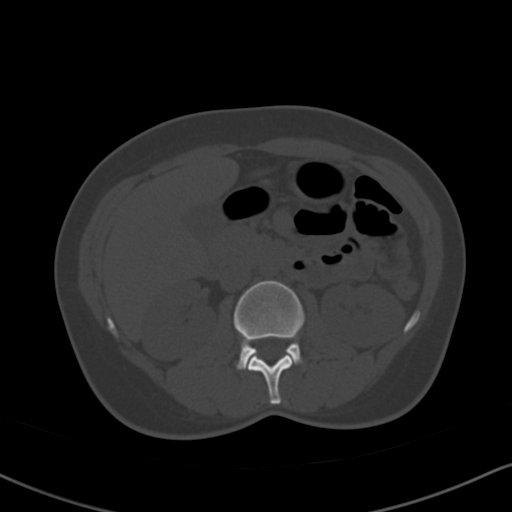
[im 102/144  soft-tissue]
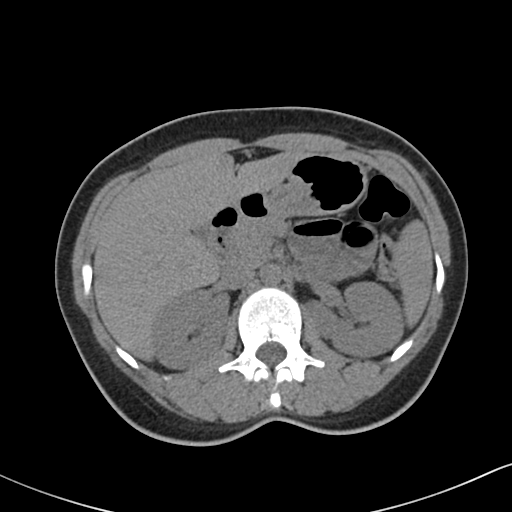
[im 114/144  soft-tissue]
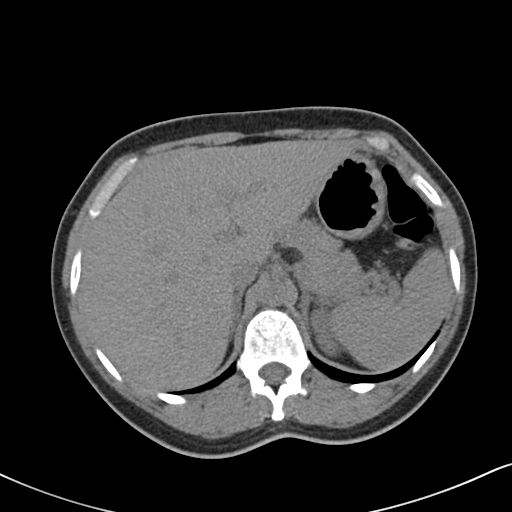
[im 120/144  lung]
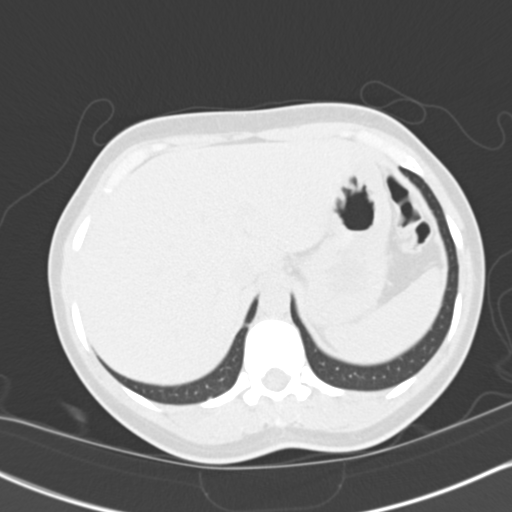
[im 126/144  soft-tissue]
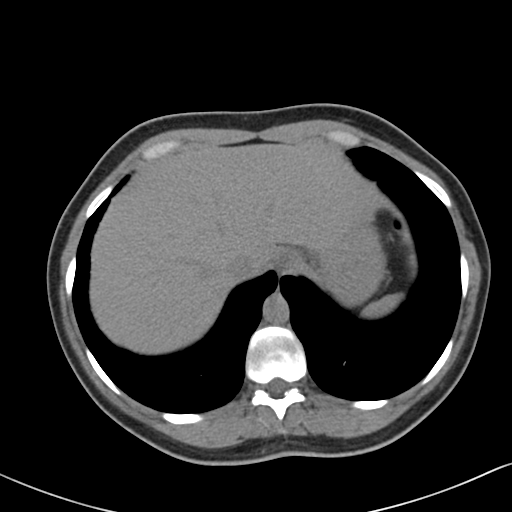
[im 126/144  lung]
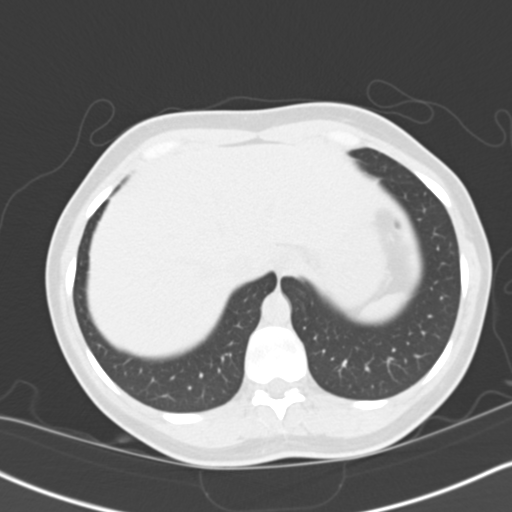
[im 132/144  lung]
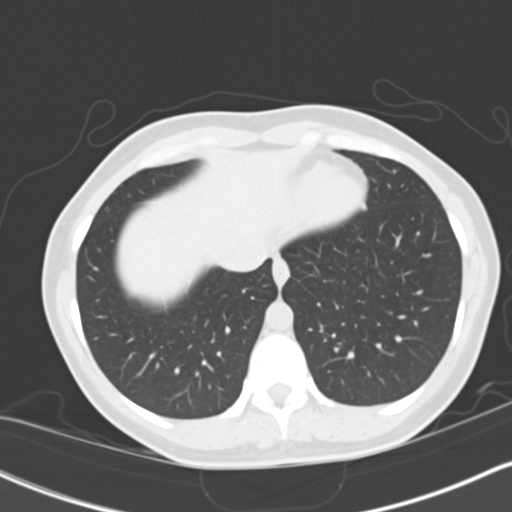
[im 138/144  soft-tissue]
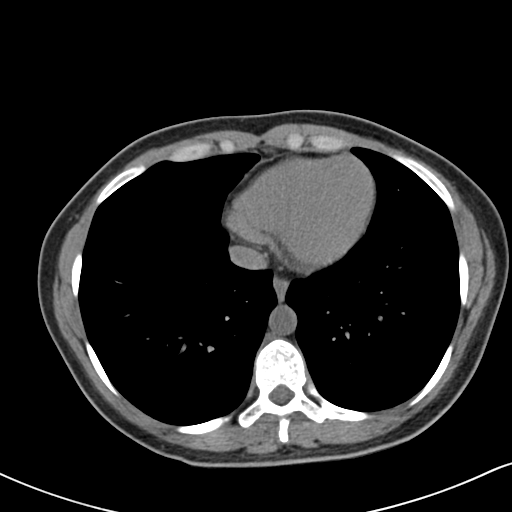
[im 138/144  lung]
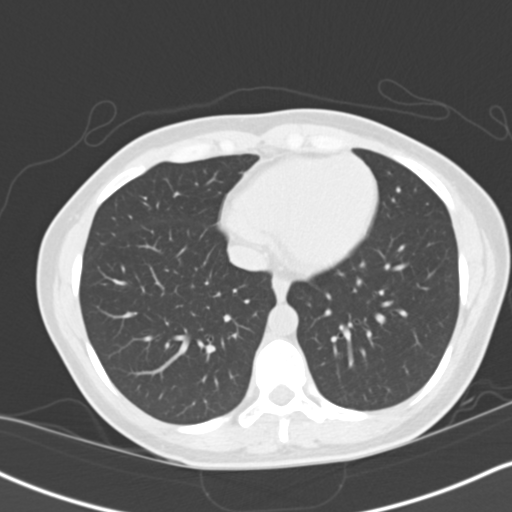

[15 of 32 positions shown; findings below may reference images not displayed]

FINDINGS: Lack of intravenous contrast limits evaluation of the solid abdominal
organs.  Small area of low-attenuation along the falciform ligament likely
represents focal fatty deposition. The gallbladder, spleen, adrenals, and
pancreas are unremarkable.

No renal calculi or hydronephrosis. No ureterectasis. There are 2 metallic
clips in the right hemiabdomen, possibly from prior tubal ligation. No clips
seen in the left adnexa. The small and large bowel are normal in caliber.
The appendix is not identified. However, there are no inflammatory changes
at the base of the cecum. There is mild stranding in the subcutaneous fat of
the right metallic, which is nonspecific.

No aggressive lytic or sclerotic osseous lesions are identified.
IMPRESSION: No renal calculi or hydronephrosis.

## 2013-03-28 IMAGING — US US PELVIS COMPLETE
1 series · 13 of 25 positions shown · non-contrast
Comparison: 07/12/2011 CT

CLINICAL DATA: Right lower quadrant pain.

TRANSABDOMINAL AND TRANSVAGINAL ULTRASOUND OF PELVIS
TECHNIQUE: Both transabdominal and transvaginal ultrasound
examinations of the pelvis were performed. Transabdominal technique
was performed for global imaging of the pelvis including uterus,
ovaries, adnexal regions, and pelvic cul-de-sac.

[Series 1: us pelvis complete · 0.28mm/px · 13 of 35 slices shown]
[im 1/35]
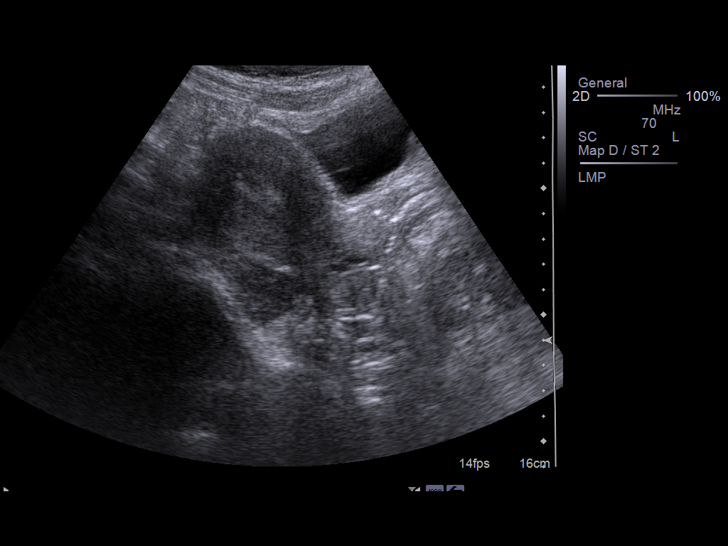
[im 3/35]
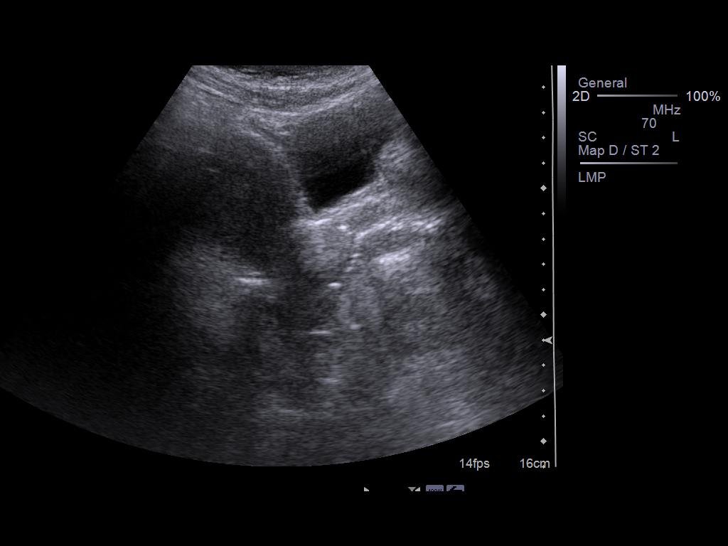
[im 6/35]
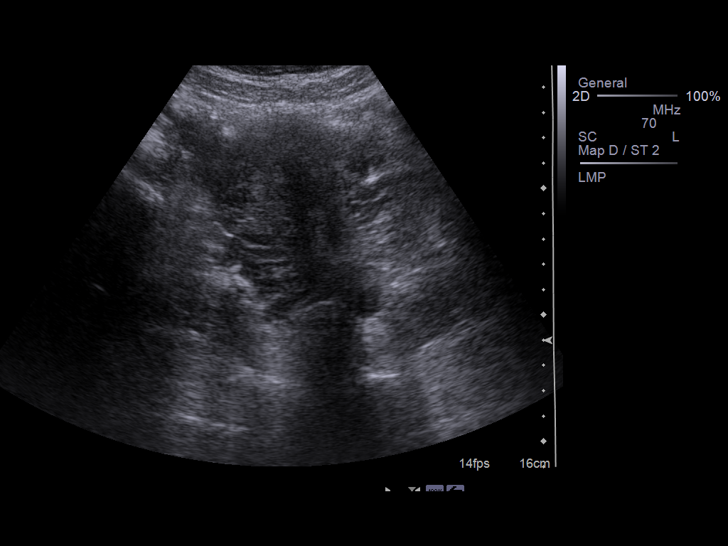
[im 9/35]
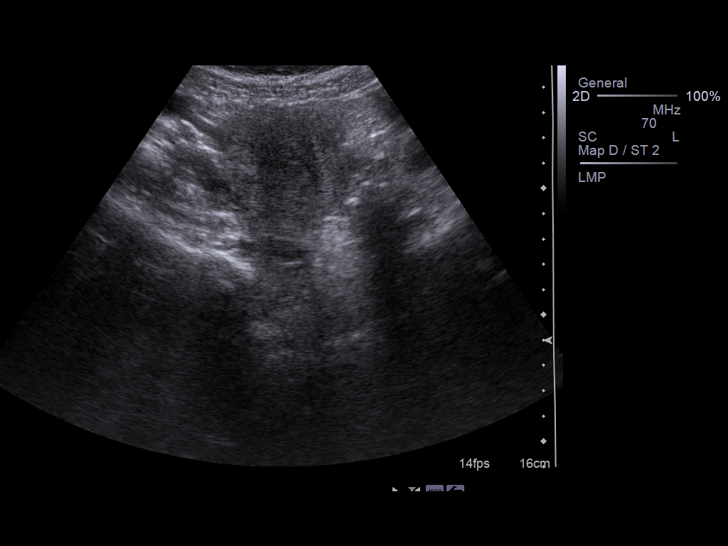
[im 12/35]
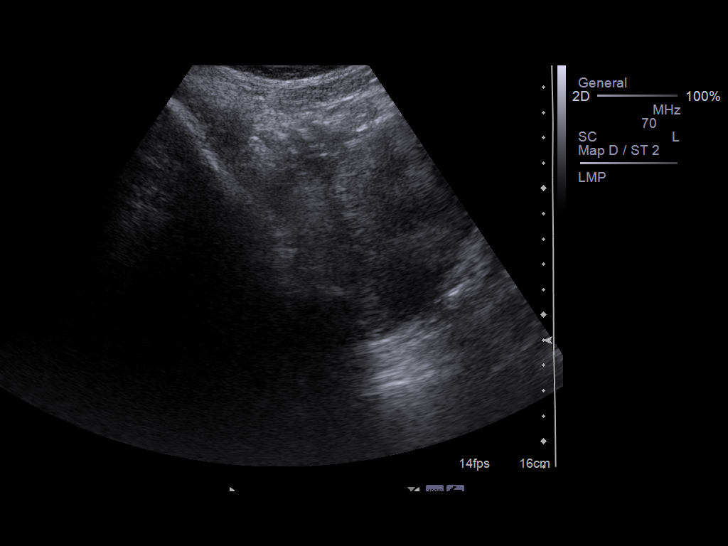
[im 15/35]
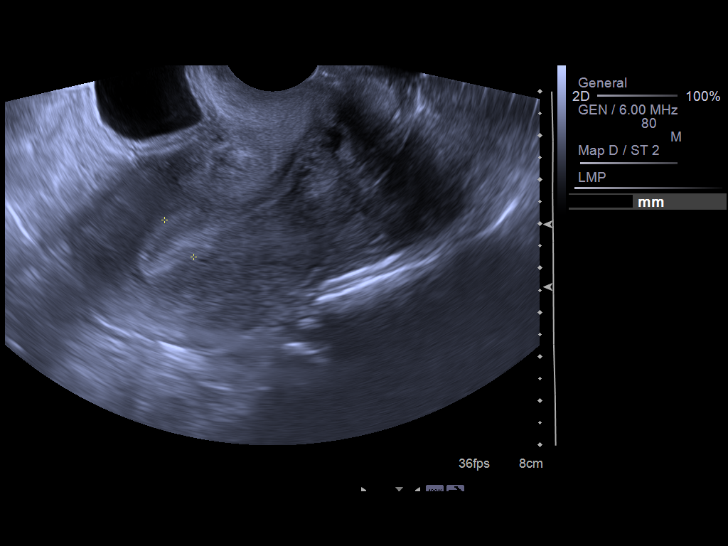
[im 18/35]
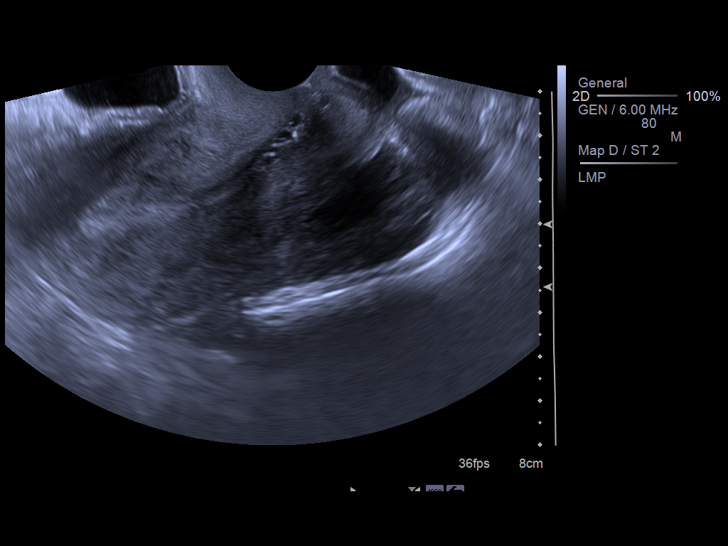
[im 20/35]
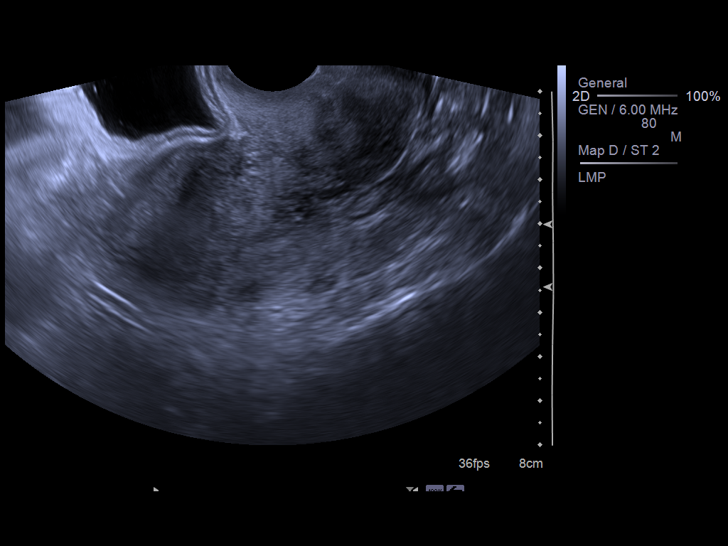
[im 23/35]
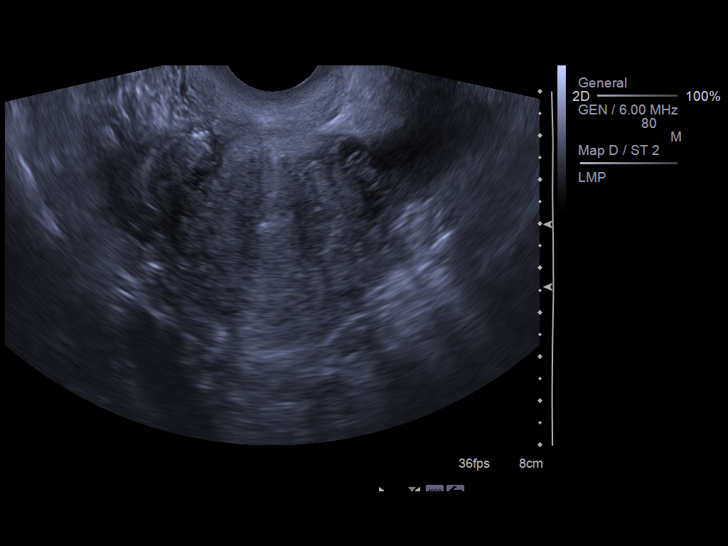
[im 26/35]
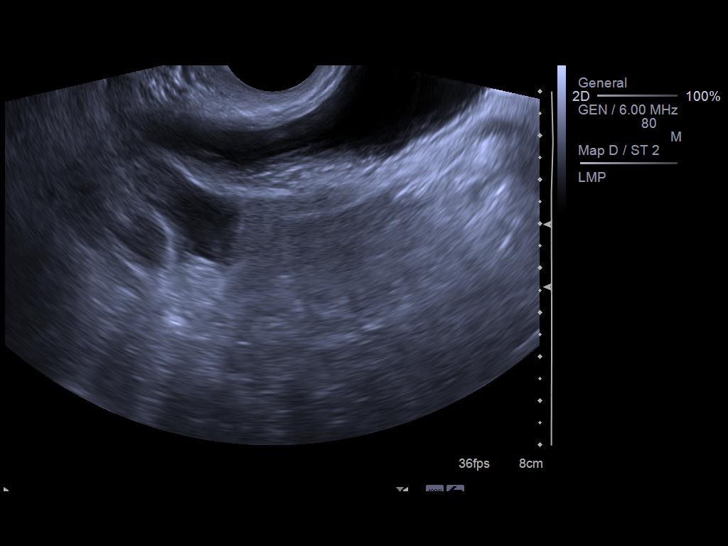
[im 29/35]
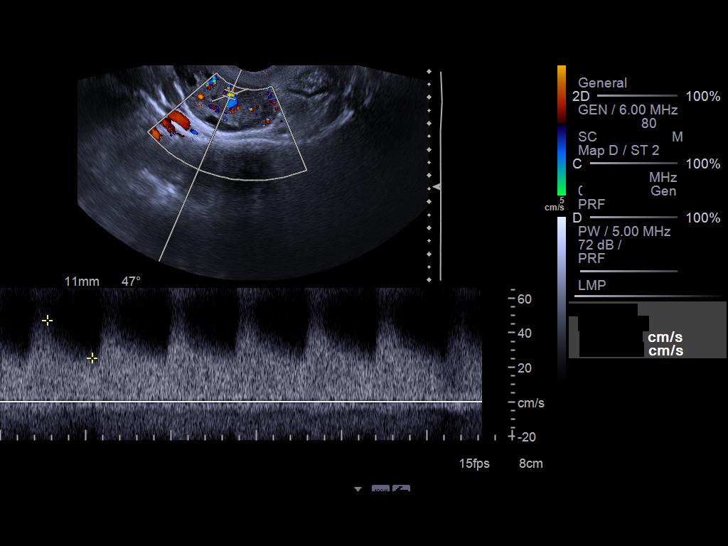
[im 32/35]
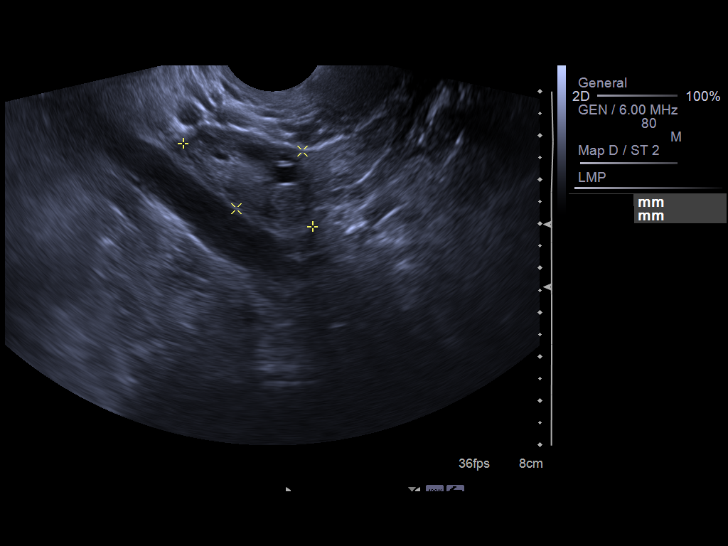
[im 35/35]
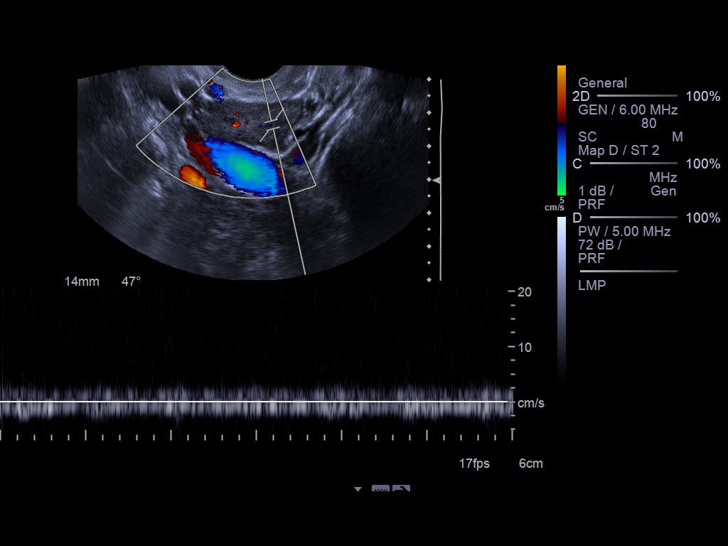

[13 of 25 positions shown; findings below may reference images not displayed]

It was necessary to proceed with endovaginal exam following the
transabdominal exam to visualize the endometrium and adnexa.
FINDINGS: Uterus: Normal in size and appearance, measuring 8.9 x 4.8 x
cm.

Endometrium: Normal in thickness and appearance, measuring 11 mm.

Right ovary:  Normal appearance/no adnexal mass.  There is a corpus
luteal cyst present.  The ovary measures 3.8 x 2.0 x 3.3 cm.
Arterial and venous wave forms and color Doppler flow documented.

Left ovary: Normal appearance/no adnexal mass, measures 3.5 x 2.0 x
1.8 cm.  Color Doppler flow with arterial and venous wave forms
documented.

Other findings: Moderate free fluid.
IMPRESSION: Right corpus luteal cyst.  Moderate free fluid.  Likely
physiologic.

Color Doppler flow with arterial and venous wave forms documented
to both ovaries..

## 2013-05-13 ENCOUNTER — Emergency Department: Payer: Self-pay | Admitting: Emergency Medicine

## 2013-05-13 LAB — COMPREHENSIVE METABOLIC PANEL
ALK PHOS: 70 U/L
ANION GAP: 8 (ref 7–16)
AST: 26 U/L (ref 15–37)
Albumin: 3.5 g/dL (ref 3.4–5.0)
BUN: 9 mg/dL (ref 7–18)
Bilirubin,Total: 0.3 mg/dL (ref 0.2–1.0)
CALCIUM: 8.8 mg/dL (ref 8.5–10.1)
CHLORIDE: 105 mmol/L (ref 98–107)
CREATININE: 0.62 mg/dL (ref 0.60–1.30)
Co2: 26 mmol/L (ref 21–32)
EGFR (African American): >60
Glucose: 98 mg/dL (ref 65–99)
Osmolality: 276 (ref 275–301)
Potassium: 4.1 mmol/L (ref 3.5–5.1)
SGPT (ALT): 34 U/L (ref 12–78)
Sodium: 139 mmol/L (ref 136–145)
Total Protein: 7.4 g/dL (ref 6.4–8.2)

## 2013-05-13 LAB — CBC WITH DIFFERENTIAL/PLATELET
BASOS ABS: 0 10*3/uL (ref 0.0–0.1)
Basophil %: 0.8 %
Eosinophil #: 0 10*3/uL (ref 0.0–0.7)
Eosinophil %: 0.6 %
HCT: 37.5 % (ref 35.0–47.0)
HGB: 12.9 g/dL (ref 12.0–16.0)
Lymphocyte #: 2.2 10*3/uL (ref 1.0–3.6)
Lymphocyte %: 35.8 %
MCH: 30.4 pg (ref 26.0–34.0)
MCHC: 34.3 g/dL (ref 32.0–36.0)
MCV: 89 fL (ref 80–100)
MONO ABS: 0.3 x10 3/mm (ref 0.2–0.9)
MONOS PCT: 4.8 %
Neutrophil #: 3.6 10*3/uL (ref 1.4–6.5)
Neutrophil %: 58 %
Platelet: 195 10*3/uL (ref 150–440)
RBC: 4.23 10*6/uL (ref 3.80–5.20)
RDW: 13.9 % (ref 11.5–14.5)
WBC: 6.2 10*3/uL (ref 3.6–11.0)

## 2013-05-13 LAB — URINALYSIS, COMPLETE
BLOOD: NEGATIVE
Bacteria: NONE SEEN
Bilirubin,UR: NEGATIVE
GLUCOSE, UR: NEGATIVE mg/dL (ref 0–75)
Ketone: NEGATIVE
Leukocyte Esterase: NEGATIVE
Nitrite: NEGATIVE
PH: 7 (ref 4.5–8.0)
PROTEIN: NEGATIVE
RBC,UR: NONE SEEN /HPF (ref 0–5)
Specific Gravity: 1.006 (ref 1.003–1.030)
Squamous Epithelial: 1

## 2013-05-13 LAB — LIPASE, BLOOD: LIPASE: 165 U/L (ref 73–393)

## 2014-07-07 NOTE — Discharge Summary (Signed)
PATIENT NAME:  Crystal Long, Crystal Long MR#:  [id number] DATE OF BIRTH:  11-Apr-1975  DATE OF ADMISSION:  04/21/2012  DATE OF DISCHARGE:   04/23/2012  HOSPITAL COURSE: The patient was admitted through the Emergency Room under involuntary commitment after having taken an overdose of clonazepam. The patient was inconsistent in her reports of her reason for it. At some point, she indicated that she was perhaps intending to harm herself, at other times she suggested that she was simply trying to get intoxicated. It seems to have been an impulsive event that got worse as the clonazepam in her system level grew until she had taken about 87 of the 0.5 mg pills. The patient was reporting that she has been under a lot of stress recently, but was not reporting a full major depressive syndrome. She was admitted to the hospital and monitored. The first day in the hospital, she reported feeling very tired and had a bit of a tremor visible but was still able to walk around without being unsteady and was eating and drinking well. Her mood was anxious and blunted, but she denied suicidal ideation. She was given 25 mg of oral Librium twice a day to calm down a tremor, which she tolerated well. The patient has shown improved insight and admits that she has an ongoing problem with substance abuse, including the benzodiazepines, as well as her past history of opiate dependence. She has been agreeable to the idea of transfer to the alcohol and drug abuse treatment center in [place]. A bed has become available, and she will be transferred today. The patient is currently medically stable with normal vital signs and calm behavior. She remained somewhat dysphoric. Denies acute suicidal ideation. She has also been treated with her venlafaxine, which had been a previously effective antidepressant at 150 mg a day, as well as trazodone 100 mg at night as needed for sleep, and Vistaril 50 mg as needed for anxiety.   LABORATORY RESULTS: Admission  labs included a drug screen positive for benzodiazepines, but otherwise negative. Chemistry panel: Completely unremarkable. Lipase normal at 190. CBC normal. Urinalysis 1+ blood, 3 white blood cells.   DISCHARGE MEDICATIONS:  Venlafaxine XR 150 mg q.a.m., trazodone 100 mg at night as needed for sleep, Vistaril 50 mg q.4-6 hours p.r.n. anxiety.   MENTAL STATUS EXAM AT DISCHARGE:  Casually dressed, reasonably well-groomed woman who looks her stated age. Cooperative with the interview. Good eye contact, normal psychomotor activity. Speech is quiet but easy to understand and normal in rate. Affect slightly blunted. Mood stated as okay. Thoughts are lucid without loosening of associations. No sign of delusional thinking. Denies auditory or visual hallucinations. Denies current suicidal or homicidal ideation. Judgment and insight are improved. Normal intelligence. Alert and oriented x 4.   DISPOSITION:  Discharge under involuntary commitment paperwork to the alcohol and drug abuse treatment center in [place] for further substance abuse treatment.   DIAGNOSIS PRINCIPLE AND PRIMARY:  AXIS I:  Benzodiazepine dependence.   SECONDARY DIAGNOSES: AXIS I:   1.  Opiate dependence, in partial remission.  2.  Depression, not otherwise specified.  AXIS II:  Deferred, but borderline traits.  AXIS III:  Status post overdose of clonazepam without sequela.  AXIS IV:  Moderate to severe from poor support, possible homelessness.  AXIS V:  Functioning at time of discharge 51.   ____________________________ Audery Amel, MD jtc:dm D: 04/23/2012 09:13:00 ET T: 04/23/2012 10:33:56 ET JOB#: [id number]  cc: Audery Amel, MD, <Dictator> Jackquline Denmark  CLAPACS MD ELECTRONICALLY SIGNED 04/26/2012 18:16

## 2014-07-07 NOTE — Consult Note (Signed)
Brief Consult Note: Diagnosis: opiate dependency and benzo dependence.   Patient was seen by consultant.   Consult note dictated.   Recommend further assessment or treatment.   Orders entered.   Discussed with Attending MD.   Comments: Psychiatry: Patient seen. Overdose on klonapin. Opiate abuse prob in withdrawl. On IVC. Ad[facility] to [facility].  Electronic Signatures: Audery Amellapacs, Raed Schalk T (MD)  (Signed 05-Feb-14 21:00)  Authored: Brief Consult Note   Last Updated: 05-Feb-14 21:00 by Audery Amellapacs, Whitley Patchen T (MD)

## 2014-07-07 NOTE — H&P (Signed)
PATIENT NAME:  Crystal Long, Crystal Long MR#:  [id number] DATE OF BIRTH:  12-Aug-1975  DATE OF ADMISSION:  04/20/2012  DATE OF EVALUATION:  04/21/2012  IDENTIFYING INFORMATION AND CHIEF COMPLAINT:  A 39 year old woman who came to the Emergency Room having overdosed on clonazepam. Her chief complaint to me was "I was just feeling bad."   HISTORY OF PRESENT ILLNESS:  The patient gives a conflicting story different to me than she had given earlier. She tells me that she came to the Emergency Room actually for abdominal pain and then after some period of frustration happened to mention to a nurse that she had taken 87 of her clonazepam in the last 24 hours. The notes imply that it was the clonazepam itself that brought her to the Emergency Room. In any case, the patient did take 87, 0.5 mg clonazepam in 24 hours. She tells me now that there was no suicidal intent to it at all. She implies that she took them a few at a time and was just trying to get high. She said she was feeling stressed because it is the 6th month anniversary of the death of her brother. She tries to minimize mood and anxiety symptoms to me. In the evaluation by nursing earlier in the day, she had reported having a lot of stress in her life feeling like her roommates were driving her crazy. She tells me that she has recently been having a reasonably good mood. She denies sleep problems. Denies suicidal ideation. Denies psychotic symptoms. She claims that she has panic attacks which happen about 2 or 3 times a week, but says that is her baseline. She tells me that she normally takes clonazepam 0.5 mg 3 to 4 times a day for her panic attacks. The patient also tells me that she is enrolled in a methadone clinic and takes 60 mg of methadone a day.   PAST PSYCHIATRIC HISTORY:  The patient tells me she has no previous history of overdose and no previous psychiatric hospitalizations. No history of suicide attempts. She is currently getting psychiatric  treatment in [place] where she sees a Publishing rights managernurse practitioner. She says she takes clonazepam 0.5 mg 3 to 4 times a day as well as Effexor-XR 150 mg a day. She also has a history of opiate dependence and says she is in a methadone clinic. She used to be on Suboxone, but can no longer afford it.   SOCIAL HISTORY:  The patient is divorced. She has 2 young children who are not in her custody. She is not currently working. Lives [place] some roommates in the [place] area. Does not have income or insurance.   PAST MEDICAL HISTORY:  Denies any significant ongoing medical problems. Has the alleged methadone clinic that she goes to as well as panic attacks.   MEDICATIONS:  Documented earlier in the day. All she said was Effexor-XR 150 mg a day and clonazepam 0.5 mg 3 to 4 a day.   ALLERGIES:  CODEINE, PENICILLIN, SULFA DRUGS AND TRAMADOL.   REVIEW OF SYSTEMS:  Currently denies depression. Denies anxiety. Denies suicidal ideation or homicidal ideation. Denies any sleep disturbance. Denies any psychotic symptoms. No significant acute medical problems other than saying that she is feeling like she is having opiate withdrawal.   MENTAL STATUS EXAM:  The patient is interviewed in a hospital Emergency Room. She is awake, easily arousable. Appropriate in her interaction. Makes good eye contact. Normal psychomotor activity. Speech normal rate, tone and volume. Affect euthymic, and reactive.  Mood stated as okay. Thoughts are lucid without any loosening of associations or delusional thinking. Denies auditory or visual hallucinations. Denies suicidal or homicidal ideation. Insight and judgment are impaired about her substance use especially. Appears to be of normal intelligence.   PHYSICAL EXAMINATION: GENERAL: The patient does not appear to be in any acute physical distress.  SKIN: No skin lesions identified.  NECK AND BACK: Nontender.  HEENT: Pupils equal and reactive. Face symmetric. Oral mucosa normal.  EXTREMITIES: Full  range of motion at all extremities. Normal gait. Strength and reflexes normal throughout.  NEUROLOGIC: Cranial nerves symmetric.  LUNGS: Clear with no wheezes.  HEART: Regular rate and rhythm.  ABDOMEN: Soft, nontender, normal bowel sounds.   VITAL SIGNS:  Blood pressure 114/62, respirations 18, pulse 76, temperature 98.2.   SMOKING STATUS:  The patient smokes cigarettes.   LABORATORY RESULTS:  Chemistries done on admission are unremarkable. Lipase is normal. The urine drug screen is positive for benzodiazepines, but otherwise negative. Blood counts are all normal.   ASSESSMENT:  This is a 39 year old woman who took a massive overdose of clonazepam, although it does not seem to have done her a great deal of harm. It suggests that she probably does have a pretty high tolerance for it. The behavior would suggest impulsivity with dangerousness to herself. She requires hospitalization for stabilization. The patient is claiming to me that she goes to a methadone clinic. Her drug screen is negative for methadone. I looked her up on the [place] controlled substance data base and it shows that she has had multiple prescriptions for doses of oral narcotics filled by multiple providers over the last several months which certainly does not suggest that she is compliant in a methadone clinic.   TREATMENT PLAN:  Admit to psychiatry. No narcotics. No benzodiazepines. Monitor vital signs and behavior. Engage the patient in substance abuse groups. Try to improve her insight and judgment. Work on making sure that she is safe before discharge.   DIAGNOSES, PRINCIPAL AND PRIMARY:  AXIS I:  1.  Opiate dependence.  2.  Benzodiazepine dependence.  3.  Panic disorder by history.  AXIS II: Deferred.  AXIS III: No diagnosis.  AXIS IV: Severe from lack of resources.  AXIS V: Functioning at time of admission 39.    ____________________________ Audery Amel, MD jtc:si D: 04/21/2012 21:09:00  ET T: 04/21/2012 21:18:52 ET JOB#: [id number]  cc: Audery Amel, MD, <Dictator> Audery Amel MD ELECTRONICALLY SIGNED 04/22/2012 0:23
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
